# Patient Record
Sex: Female | Born: 1950 | Race: White | Hispanic: No | Marital: Married | Smoking: Never smoker
Health system: Southern US, Community
[De-identification: ages and names within clinical notes are randomized; demographics above are authoritative.]

---

## 1998-02-18 ENCOUNTER — Ambulatory Visit (HOSPITAL_BASED_OUTPATIENT_CLINIC_OR_DEPARTMENT_OTHER): Admission: RE | Admit: 1998-02-18 | Discharge: 1998-02-18 | Payer: Self-pay | Admitting: General Surgery

## 1998-06-02 ENCOUNTER — Other Ambulatory Visit: Admission: RE | Admit: 1998-06-02 | Discharge: 1998-06-02 | Payer: Self-pay | Admitting: Obstetrics & Gynecology

## 1999-05-19 ENCOUNTER — Other Ambulatory Visit: Admission: RE | Admit: 1999-05-19 | Discharge: 1999-05-19 | Payer: Self-pay | Admitting: *Deleted

## 1999-06-03 ENCOUNTER — Encounter: Admission: RE | Admit: 1999-06-03 | Discharge: 1999-06-03 | Payer: Self-pay | Admitting: Family Medicine

## 1999-06-03 ENCOUNTER — Encounter: Payer: Self-pay | Admitting: Family Medicine

## 2000-06-29 ENCOUNTER — Other Ambulatory Visit: Admission: RE | Admit: 2000-06-29 | Discharge: 2000-06-29 | Payer: Self-pay | Admitting: *Deleted

## 2000-08-08 ENCOUNTER — Encounter: Admission: RE | Admit: 2000-08-08 | Discharge: 2000-08-08 | Payer: Self-pay | Admitting: Family Medicine

## 2000-08-08 ENCOUNTER — Encounter: Payer: Self-pay | Admitting: Family Medicine

## 2001-07-19 ENCOUNTER — Other Ambulatory Visit: Admission: RE | Admit: 2001-07-19 | Discharge: 2001-07-19 | Payer: Self-pay | Admitting: *Deleted

## 2001-09-06 ENCOUNTER — Encounter: Admission: RE | Admit: 2001-09-06 | Discharge: 2001-09-06 | Payer: Self-pay | Admitting: *Deleted

## 2002-03-09 ENCOUNTER — Ambulatory Visit (HOSPITAL_COMMUNITY): Admission: RE | Admit: 2002-03-09 | Discharge: 2002-03-09 | Payer: Self-pay | Admitting: *Deleted

## 2002-07-24 ENCOUNTER — Other Ambulatory Visit: Admission: RE | Admit: 2002-07-24 | Discharge: 2002-07-24 | Payer: Self-pay | Admitting: *Deleted

## 2002-09-24 ENCOUNTER — Encounter: Admission: RE | Admit: 2002-09-24 | Discharge: 2002-09-24 | Payer: Self-pay | Admitting: *Deleted

## 2003-08-21 ENCOUNTER — Other Ambulatory Visit: Admission: RE | Admit: 2003-08-21 | Discharge: 2003-08-21 | Payer: Self-pay | Admitting: *Deleted

## 2003-12-11 ENCOUNTER — Ambulatory Visit (HOSPITAL_COMMUNITY): Admission: RE | Admit: 2003-12-11 | Discharge: 2003-12-11 | Payer: Self-pay | Admitting: *Deleted

## 2005-01-12 ENCOUNTER — Ambulatory Visit (HOSPITAL_COMMUNITY): Admission: RE | Admit: 2005-01-12 | Discharge: 2005-01-12 | Payer: Self-pay | Admitting: *Deleted

## 2006-01-18 ENCOUNTER — Ambulatory Visit (HOSPITAL_COMMUNITY): Admission: RE | Admit: 2006-01-18 | Discharge: 2006-01-18 | Payer: Self-pay | Admitting: *Deleted

## 2007-02-07 ENCOUNTER — Ambulatory Visit (HOSPITAL_COMMUNITY): Admission: RE | Admit: 2007-02-07 | Discharge: 2007-02-07 | Payer: Self-pay | Admitting: Obstetrics & Gynecology

## 2008-02-09 ENCOUNTER — Ambulatory Visit (HOSPITAL_COMMUNITY): Admission: RE | Admit: 2008-02-09 | Discharge: 2008-02-09 | Payer: Self-pay | Admitting: Obstetrics & Gynecology

## 2009-02-18 ENCOUNTER — Ambulatory Visit (HOSPITAL_COMMUNITY): Admission: RE | Admit: 2009-02-18 | Discharge: 2009-02-18 | Payer: Self-pay | Admitting: Family Medicine

## 2009-02-21 ENCOUNTER — Encounter: Admission: RE | Admit: 2009-02-21 | Discharge: 2009-02-21 | Payer: Self-pay | Admitting: Family Medicine

## 2010-05-01 ENCOUNTER — Encounter: Admission: RE | Admit: 2010-05-01 | Discharge: 2010-05-01 | Payer: Self-pay | Admitting: Family Medicine

## 2011-11-02 ENCOUNTER — Other Ambulatory Visit: Payer: Self-pay | Admitting: Family Medicine

## 2011-11-02 DIAGNOSIS — Z1231 Encounter for screening mammogram for malignant neoplasm of breast: Secondary | ICD-10-CM

## 2011-11-17 ENCOUNTER — Ambulatory Visit: Payer: Self-pay

## 2011-12-02 ENCOUNTER — Ambulatory Visit
Admission: RE | Admit: 2011-12-02 | Discharge: 2011-12-02 | Disposition: A | Discharge status: Home / Self Care | Payer: BC Managed Care – PPO | Source: Ambulatory Visit | Attending: Family Medicine | Admitting: Family Medicine

## 2011-12-02 DIAGNOSIS — Z1231 Encounter for screening mammogram for malignant neoplasm of breast: Secondary | ICD-10-CM

## 2013-02-27 ENCOUNTER — Other Ambulatory Visit (HOSPITAL_COMMUNITY): Payer: Self-pay | Admitting: Family Medicine

## 2013-02-27 DIAGNOSIS — Z1231 Encounter for screening mammogram for malignant neoplasm of breast: Secondary | ICD-10-CM

## 2013-03-13 ENCOUNTER — Ambulatory Visit (HOSPITAL_COMMUNITY)
Admission: RE | Admit: 2013-03-13 | Discharge: 2013-03-13 | Disposition: A | Discharge status: Home / Self Care | Payer: BC Managed Care – PPO | Source: Ambulatory Visit | Attending: Family Medicine | Admitting: Family Medicine

## 2013-03-13 DIAGNOSIS — Z1231 Encounter for screening mammogram for malignant neoplasm of breast: Secondary | ICD-10-CM | POA: Insufficient documentation

## 2014-05-08 ENCOUNTER — Other Ambulatory Visit (HOSPITAL_COMMUNITY): Payer: Self-pay | Admitting: Family Medicine

## 2014-05-08 DIAGNOSIS — Z1231 Encounter for screening mammogram for malignant neoplasm of breast: Secondary | ICD-10-CM

## 2014-05-28 ENCOUNTER — Ambulatory Visit (HOSPITAL_COMMUNITY)
Admission: RE | Admit: 2014-05-28 | Discharge: 2014-05-28 | Disposition: A | Discharge status: Home / Self Care | Payer: BC Managed Care – PPO | Source: Ambulatory Visit | Attending: Family Medicine | Admitting: Family Medicine

## 2014-05-28 DIAGNOSIS — Z1231 Encounter for screening mammogram for malignant neoplasm of breast: Secondary | ICD-10-CM | POA: Diagnosis present

## 2014-07-28 ENCOUNTER — Encounter (HOSPITAL_COMMUNITY): Payer: Self-pay | Admitting: *Deleted

## 2014-07-28 ENCOUNTER — Emergency Department (HOSPITAL_COMMUNITY): Payer: BLUE CROSS/BLUE SHIELD

## 2014-07-28 ENCOUNTER — Emergency Department (HOSPITAL_COMMUNITY)
Admission: EM | Admit: 2014-07-28 | Discharge: 2014-07-28 | Disposition: A | Discharge status: Home / Self Care | Payer: BLUE CROSS/BLUE SHIELD | Attending: Emergency Medicine | Admitting: Emergency Medicine

## 2014-07-28 DIAGNOSIS — N201 Calculus of ureter: Secondary | ICD-10-CM | POA: Diagnosis not present

## 2014-07-28 DIAGNOSIS — N132 Hydronephrosis with renal and ureteral calculous obstruction: Secondary | ICD-10-CM

## 2014-07-28 DIAGNOSIS — Z79899 Other long term (current) drug therapy: Secondary | ICD-10-CM | POA: Insufficient documentation

## 2014-07-28 DIAGNOSIS — N1330 Unspecified hydronephrosis: Secondary | ICD-10-CM | POA: Insufficient documentation

## 2014-07-28 DIAGNOSIS — N23 Unspecified renal colic: Secondary | ICD-10-CM

## 2014-07-28 DIAGNOSIS — R109 Unspecified abdominal pain: Secondary | ICD-10-CM

## 2014-07-28 LAB — URINALYSIS, ROUTINE W REFLEX MICROSCOPIC
BILIRUBIN URINE: NEGATIVE
GLUCOSE, UA: NEGATIVE mg/dL
Hgb urine dipstick: NEGATIVE
KETONES UR: 15 mg/dL — AB
LEUKOCYTES UA: NEGATIVE
NITRITE: NEGATIVE
PH: 7 (ref 5.0–8.0)
Protein, ur: NEGATIVE mg/dL
Specific Gravity, Urine: 1.025 (ref 1.005–1.030)
Urobilinogen, UA: 0.2 mg/dL (ref 0.0–1.0)

## 2014-07-28 LAB — CBC WITH DIFFERENTIAL/PLATELET
Basophils Absolute: 0 10*3/uL (ref 0.0–0.1)
Basophils Relative: 0 % (ref 0–1)
EOS ABS: 0 10*3/uL (ref 0.0–0.7)
Eosinophils Relative: 0 % (ref 0–5)
HEMATOCRIT: 40.5 % (ref 36.0–46.0)
Hemoglobin: 13.8 g/dL (ref 12.0–15.0)
LYMPHS ABS: 0.6 10*3/uL — AB (ref 0.7–4.0)
LYMPHS PCT: 5 % — AB (ref 12–46)
MCH: 30.7 pg (ref 26.0–34.0)
MCHC: 34.1 g/dL (ref 30.0–36.0)
MCV: 90.2 fL (ref 78.0–100.0)
MONO ABS: 0.5 10*3/uL (ref 0.1–1.0)
Monocytes Relative: 5 % (ref 3–12)
NEUTROS ABS: 10 10*3/uL — AB (ref 1.7–7.7)
Neutrophils Relative %: 90 % — ABNORMAL HIGH (ref 43–77)
PLATELETS: 229 10*3/uL (ref 150–400)
RBC: 4.49 MIL/uL (ref 3.87–5.11)
RDW: 12.4 % (ref 11.5–15.5)
WBC: 11.1 10*3/uL — ABNORMAL HIGH (ref 4.0–10.5)

## 2014-07-28 LAB — BASIC METABOLIC PANEL
ANION GAP: 9 (ref 5–15)
BUN: 23 mg/dL (ref 6–23)
CHLORIDE: 106 mmol/L (ref 96–112)
CO2: 24 mmol/L (ref 19–32)
CREATININE: 0.96 mg/dL (ref 0.50–1.10)
Calcium: 9.8 mg/dL (ref 8.4–10.5)
GFR calc Af Amer: 71 mL/min — ABNORMAL LOW (ref >90)
GFR, EST NON AFRICAN AMERICAN: 62 mL/min — AB (ref >90)
GLUCOSE: 143 mg/dL — AB (ref 70–99)
POTASSIUM: 3.6 mmol/L (ref 3.5–5.1)
SODIUM: 139 mmol/L (ref 135–145)

## 2014-07-28 MED ORDER — ONDANSETRON HCL 4 MG/2ML IJ SOLN
4.0000 mg | Freq: Once | INTRAMUSCULAR | Status: AC
Start: 2014-07-28 — End: 2014-07-28
  Administered 2014-07-28: 4 mg via INTRAVENOUS
  Filled 2014-07-28: qty 2

## 2014-07-28 MED ORDER — ONDANSETRON 8 MG PO TBDP: 8.0000 mg | ORAL_TABLET | Freq: Three times a day (TID) | ORAL | Status: AC | PRN

## 2014-07-28 MED ORDER — ONDANSETRON HCL 4 MG/2ML IJ SOLN
4.0000 mg | Freq: Once | INTRAMUSCULAR | Status: AC
  Administered 2014-07-28: 4 mg via INTRAVENOUS
  Filled 2014-07-28: qty 2

## 2014-07-28 MED ORDER — HYDROCODONE-ACETAMINOPHEN 5-325 MG PO TABS
1.0000 | ORAL_TABLET | Freq: Once | ORAL | Status: AC
  Administered 2014-07-28: 1 via ORAL
  Filled 2014-07-28: qty 1

## 2014-07-28 MED ORDER — HYDROMORPHONE HCL 1 MG/ML IJ SOLN
1.0000 mg | Freq: Once | INTRAMUSCULAR | Status: AC
Start: 2014-07-28 — End: 2014-07-28
  Administered 2014-07-28: 1 mg via INTRAVENOUS
  Filled 2014-07-28: qty 1

## 2014-07-28 MED ORDER — HYDROCODONE-ACETAMINOPHEN 5-325 MG PO TABS: 1.0000 | ORAL_TABLET | Freq: Four times a day (QID) | ORAL | Status: AC | PRN

## 2014-07-28 MED ORDER — FENTANYL CITRATE 0.05 MG/ML IJ SOLN
50.0000 ug | Freq: Once | INTRAMUSCULAR | Status: AC
Start: 2014-07-28 — End: 2014-07-28
  Administered 2014-07-28: 50 ug via INTRAVENOUS
  Filled 2014-07-28: qty 2

## 2014-07-28 NOTE — ED Notes (Signed)
Per pt report: pt reports having burning in her vagina and urgency that began around 02:00.  At about 05:00, pt began to have nausea and vomiting. Pt also reports having pain in her lower right side of her back.  Pt a/o x 4. Skin warm and dry. Pt ambulatory.

## 2014-07-28 NOTE — ED Provider Notes (Signed)
CSN: [id number]     Arrival date & time 07/28/14  8250 History   First MD Initiated Contact with Patient 07/28/14 [phone]     Chief Complaint  Patient presents with  . Flank Pain  . Vaginal Pain     (Consider location/radiation/quality/duration/timing/severity/associated sxs/prior Treatment) HPI Comments: Pt comes in with cc of flank pain. Pt has no medical hx. She reports waking up at 2 am, feeling some burning type pain around her vagina, spasming type pain. Soon after, she started having R back pain. Pt has associated nausea, and may be some cold sweats too. Pain is constant, and staying in the same region. No hx of same problems in the past. Pt has no pelvic organ dysfunction, no hx of renal stones and she has not noted any bloody in her urine.   ROS 10 Systems reviewed and are negative for acute change except as noted in the HPI.     Patient is a 64 y.o. female presenting with flank pain and vaginal pain. The history is provided by the patient.  Flank Pain  Vaginal Pain    History reviewed. No pertinent past medical history. History reviewed. No pertinent past surgical history. No family history on file. History  Substance Use Topics  . Smoking status: Never Smoker   . Smokeless tobacco: Not on file  . Alcohol Use: No   OB History    No data available     Review of Systems  Constitutional: Positive for diaphoresis.  Gastrointestinal: Positive for nausea.  Genitourinary: Positive for flank pain and vaginal pain.  All other systems reviewed and are negative.     Allergies  Review of patient's allergies indicates no known allergies.  Home Medications   Prior to Admission medications   Medication Sig Start Date End Date Taking? Authorizing Provider  aspirin EC 81 MG tablet Take 81 mg by mouth 2 (two) times a week.   Yes Historical Provider, MD  cholecalciferol (VITAMIN D) 1000 UNITS tablet Take 1,000 Units by mouth daily.   Yes Historical Provider, MD   BP  131/63 mmHg  Pulse 63  Temp(Src) 97.5 F (36.4 C) (Oral)  Resp 15  SpO2 100% Physical Exam  Constitutional: She is oriented to person, place, and time. She appears well-developed and well-nourished.  HENT:  Head: Normocephalic and atraumatic.  Eyes: EOM are normal. Pupils are equal, round, and reactive to light.  Neck: Neck supple.  Cardiovascular: Normal rate, regular rhythm and normal heart sounds.   No murmur heard. Pulmonary/Chest: Effort normal. No respiratory distress.  Abdominal: Soft. She exhibits no distension. There is tenderness. There is no rebound and no guarding.  Rt flank tenderness  Neurological: She is alert and oriented to person, place, and time.  Skin: Skin is warm and dry.  Nursing note and vitals reviewed.   ED Course  Procedures (including critical care time) Labs Review Labs Reviewed  CBC WITH DIFFERENTIAL/PLATELET - Abnormal; Notable for the following:    WBC 11.1 (*)    Neutrophils Relative % 90 (*)    Neutro Abs 10.0 (*)    Lymphocytes Relative 5 (*)    Lymphs Abs 0.6 (*)    All other components within normal limits  BASIC METABOLIC PANEL - Abnormal; Notable for the following:    Glucose, Bld 143 (*)    GFR calc non Af Amer 62 (*)    GFR calc Af Amer 71 (*)    All other components within normal limits  URINALYSIS, ROUTINE W  REFLEX MICROSCOPIC - Abnormal; Notable for the following:    Ketones, ur 15 (*)    All other components within normal limits  URINE CULTURE    Imaging Review Ct Renal Stone Study  07/28/2014   CLINICAL DATA:  Right-sided flank pain since 2 a.m. this morning. Vaginal pain/burning. Nausea and vomiting. Evaluate renal stone.  EXAM: CT ABDOMEN AND PELVIS WITHOUT CONTRAST  TECHNIQUE: Multidetector CT imaging of the abdomen and pelvis was performed following the standard protocol without IV contrast.  COMPARISON:  None.  FINDINGS: The lack of intravenous contrast limits the ability to evaluate solid abdominal organs.  There is a  punctate (approximately 0.4 x 0.2 cm stone within the distal aspect of the right ureter (image 63, series 2) which results in mild upstream ureterectasis and moderate right-sided pelvicaliectasis. There is an additional punctate (approximately 1 mm) nonobstructing stone within the interpolar aspect of the right kidney (axial image 33, series 2).  No evidence of left-sided nephrolithiasis or urinary obstruction. Normal noncontrast appearance of the urinary bladder given degree distention.   ----------------------------------------------------------------------------  Normal hepatic contour. Normal appearance of the gallbladder given degree distention. No radiopaque gallstones. No ascites.  Normal noncontrast appearance of the bilateral adrenal glands, pancreas and spleen.  Bowel is normal in course and caliber without wall thickening or evidence of obstruction the appendix is not definitely identified, however is no pericecal inflammatory change. No pneumoperitoneum, pneumatosis or portal venous gas.  Scattered very minimal amount of atherosclerotic plaque within a normal caliber abdominal aorta. No definitive bulky retroperitoneal, mesenteric, pelvic or inguinal lymphadenopathy on this noncontrast examination.  Normal noncontrast appearance of the pelvic organs. No free fluid within the pelvic cul-de-sac.  Limited visualization the lower thorax demonstrates minimal subsegmental atelectasis within the imaged bilateral lower lobes. No discrete focal airspace opacities. No pleural effusion.  Normal heart size.  No pericardial effusion.  No acute or aggressive osseous abnormalities. Moderate DDD of L4-L5 with disc space height loss, endplate irregularity and sclerosis.  Regional soft tissues appear normal.  IMPRESSION: 1. Punctate (approximately 4 x 2 mm) stone within the distal aspect of the right ureter results in mild upstream ureterectasis and moderate right-sided pelvicaliectasis. 2. Note is made of an additional  punctate (approximately 1-2 mm) nonobstructing is stone within the interpolar aspect of the right kidney. 3. No evidence of left-sided nephrolithiasis or urinary obstruction.   Electronically Signed   By: Sandi Mariscal M.D.   On: 07/28/2014 08:30     EKG Interpretation None      9:56 AM Pain controlled now. Given fentanyl essentially, now dilaudid. CT results discussed.  Return precautions discussed.     MDM   Final diagnoses:  Acute right flank pain  Ureteral stone with hydronephrosis  Ureteral colic    Pt comes in with cc of acute R flank pain and some burning type sensation around her vagina with urgency. UA is clean. Sx started suddenly, and CT shows renal stone, which is what we suspected. The stone is at the UPJ and is causing some inflammation. Pain is under control now, so anticipate discharge.     Varney Biles, MD 07/28/14 1002

## 2014-07-28 NOTE — Discharge Instructions (Signed)
You have a kidney stone. Currently, the symptoms are under control, and so we can safely discharge you. Most of the stones pass on their own, and we need to just control the pain. Call the Urologist for an appointment, if the pain continues - even if it is tolerable. Come to the ER if the pain is intolerable. Also come back to the ER if there are fevers, chills, inability to keep any fluids down, confusion, large blood clots passing.   Kidney Stones Kidney stones (urolithiasis) are deposits that form inside your kidneys. The intense pain is caused by the stone moving through the urinary tract. When the stone moves, the ureter goes into spasm around the stone. The stone is usually passed in the urine.  CAUSES   A disorder that makes certain neck glands produce too much parathyroid hormone (primary hyperparathyroidism).  A buildup of uric acid crystals, similar to gout in your joints.  Narrowing (stricture) of the ureter.  A kidney obstruction present at birth (congenital obstruction).  Previous surgery on the kidney or ureters.  Numerous kidney infections. SYMPTOMS   Feeling sick to your stomach (nauseous).  Throwing up (vomiting).  Blood in the urine (hematuria).  Pain that usually spreads (radiates) to the groin.  Frequency or urgency of urination. DIAGNOSIS   Taking a history and physical exam.  Blood or urine tests.  CT scan.  Occasionally, an examination of the inside of the urinary bladder (cystoscopy) is performed. TREATMENT   Observation.  Increasing your fluid intake.  Extracorporeal shock wave lithotripsy--This is a noninvasive procedure that uses shock waves to break up kidney stones.  Surgery may be needed if you have severe pain or persistent obstruction. There are various surgical procedures. Most of the procedures are performed with the use of small instruments. Only small incisions are needed to accommodate these instruments, so recovery time is  minimized. The size, location, and chemical composition are all important variables that will determine the proper choice of action for you. Talk to your health care provider to better understand your situation so that you will minimize the risk of injury to yourself and your kidney.  HOME CARE INSTRUCTIONS   Drink enough water and fluids to keep your urine clear or pale yellow. This will help you to pass the stone or stone fragments.  Strain all urine through the provided strainer. Keep all particulate matter and stones for your health care provider to see. The stone causing the pain may be as small as a grain of salt. It is very important to use the strainer each and every time you pass your urine. The collection of your stone will allow your health care provider to analyze it and verify that a stone has actually passed. The stone analysis will often identify what you can do to reduce the incidence of recurrences.  Only take over-the-counter or prescription medicines for pain, discomfort, or fever as directed by your health care provider.  Make a follow-up appointment with your health care provider as directed.  Get follow-up X-rays if required. The absence of pain does not always mean that the stone has passed. It may have only stopped moving. If the urine remains completely obstructed, it can cause loss of kidney function or even complete destruction of the kidney. It is your responsibility to make sure X-rays and follow-ups are completed. Ultrasounds of the kidney can show blockages and the status of the kidney. Ultrasounds are not associated with any radiation and can be performed easily  in a matter of minutes. SEEK MEDICAL CARE IF:  You experience pain that is progressive and unresponsive to any pain medicine you have been prescribed. SEEK IMMEDIATE MEDICAL CARE IF:   Pain cannot be controlled with the prescribed medicine.  You have a fever or shaking chills.  The severity or intensity  of pain increases over 18 hours and is not relieved by pain medicine.  You develop a new onset of abdominal pain.  You feel faint or pass out.  You are unable to urinate. MAKE SURE YOU:   Understand these instructions.  Will watch your condition.  Will get help right away if you are not doing well or get worse. Document Released: 06/07/2005 Document Revised: 02/07/2013 Document Reviewed: 11/08/2012 [facility] Patient Information 2015 [place], [place]. This information is not intended to replace advice given to you by your health care provider. Make sure you discuss any questions you have with your health care provider.  Ureteral Colic (Kidney Stones) Ureteral colic is the result of a condition when kidney stones form inside the kidney. Once kidney stones are formed they may move into the tube that connects the kidney with the bladder (ureter). If this occurs, this condition may cause pain (colic) in the ureter.  CAUSES  Pain is caused by stone movement in the ureter and the obstruction caused by the stone. SYMPTOMS  The pain comes and goes as the ureter contracts around the stone. The pain is usually intense, sharp, and stabbing in character. The location of the pain may move as the stone moves through the ureter. When the stone is near the kidney the pain is usually located in the back and radiates to the belly (abdomen). When the stone is ready to pass into the bladder the pain is often located in the lower abdomen on the side the stone is located. At this location, the symptoms may mimic those of a urinary tract infection with urinary frequency. Once the stone is located here it often passes into the bladder and the pain disappears completely. TREATMENT   Your caregiver will provide you with medicine for pain relief.  You may require specialized follow-up X-rays.  The absence of pain does not always mean that the stone has passed. It may have just stopped moving. If the urine remains  completely obstructed, it can cause loss of kidney function or even complete destruction of the involved kidney. It is your responsibility and in your interest that X-rays and follow-ups as suggested by your caregiver are completed. Relief of pain without passage of the stone can be associated with severe damage to the kidney, including loss of kidney function on that side.  If your stone does not pass on its own, additional measures may be taken by your caregiver to ensure its removal. HOME CARE INSTRUCTIONS   Increase your fluid intake. Water is the preferred fluid since juices containing vitamin C may acidify the urine making it less likely for certain stones (uric acid stones) to pass.  Strain all urine. A strainer will be provided. Keep all particulate matter or stones for your caregiver to inspect.  Take your pain medicine as directed.  Make a follow-up appointment with your caregiver as directed.  Remember that the goal is passage of your stone. The absence of pain does not mean the stone is gone. Follow your caregiver's instructions.  Only take over-the-counter or prescription medicines for pain, discomfort, or fever as directed by your caregiver. SEEK MEDICAL CARE IF:   Pain cannot  be controlled with the prescribed medicine.  You have a fever.  Pain continues for longer than your caregiver advises it should.  There is a change in the pain, and you develop chest discomfort or constant abdominal pain.  You feel faint or pass out. MAKE SURE YOU:   Understand these instructions.  Will watch your condition.  Will get help right away if you are not doing well or get worse. Document Released: 03/17/2005 Document Revised: 10/02/2012 Document Reviewed: 12/02/2010 [facility] Patient Information 2015 [place], [place]. This information is not intended to replace advice given to you by your health care provider. Make sure you discuss any questions you have with your health care  provider.

## 2014-07-30 LAB — URINE CULTURE
COLONY COUNT: NO GROWTH
CULTURE: NO GROWTH

## 2015-08-14 ENCOUNTER — Other Ambulatory Visit: Payer: Self-pay

## 2015-08-14 DIAGNOSIS — Z1231 Encounter for screening mammogram for malignant neoplasm of breast: Secondary | ICD-10-CM

## 2015-08-27 ENCOUNTER — Ambulatory Visit
Admission: RE | Admit: 2015-08-27 | Discharge: 2015-08-27 | Disposition: A | Discharge status: Home / Self Care | Payer: BLUE CROSS/BLUE SHIELD | Source: Ambulatory Visit

## 2015-08-27 DIAGNOSIS — Z1231 Encounter for screening mammogram for malignant neoplasm of breast: Secondary | ICD-10-CM

## 2016-03-03 DIAGNOSIS — Z0000 Encounter for general adult medical examination without abnormal findings: Secondary | ICD-10-CM | POA: Diagnosis not present

## 2016-03-03 DIAGNOSIS — Z13820 Encounter for screening for osteoporosis: Secondary | ICD-10-CM | POA: Diagnosis not present

## 2016-03-03 DIAGNOSIS — Z23 Encounter for immunization: Secondary | ICD-10-CM | POA: Diagnosis not present

## 2016-03-03 DIAGNOSIS — Z79899 Other long term (current) drug therapy: Secondary | ICD-10-CM | POA: Diagnosis not present

## 2016-03-03 DIAGNOSIS — E785 Hyperlipidemia, unspecified: Secondary | ICD-10-CM | POA: Diagnosis not present

## 2016-03-23 DIAGNOSIS — M8588 Other specified disorders of bone density and structure, other site: Secondary | ICD-10-CM | POA: Diagnosis not present

## 2016-03-23 DIAGNOSIS — E2839 Other primary ovarian failure: Secondary | ICD-10-CM | POA: Diagnosis not present

## 2016-04-02 DIAGNOSIS — M899 Disorder of bone, unspecified: Secondary | ICD-10-CM | POA: Diagnosis not present

## 2016-05-07 DIAGNOSIS — M25512 Pain in left shoulder: Secondary | ICD-10-CM | POA: Diagnosis not present

## 2016-05-18 DIAGNOSIS — M25512 Pain in left shoulder: Secondary | ICD-10-CM | POA: Diagnosis not present

## 2016-05-18 DIAGNOSIS — M7542 Impingement syndrome of left shoulder: Secondary | ICD-10-CM | POA: Diagnosis not present

## 2016-05-18 DIAGNOSIS — M7502 Adhesive capsulitis of left shoulder: Secondary | ICD-10-CM | POA: Diagnosis not present

## 2016-06-16 DIAGNOSIS — M25512 Pain in left shoulder: Secondary | ICD-10-CM | POA: Diagnosis not present

## 2016-06-22 DIAGNOSIS — M7542 Impingement syndrome of left shoulder: Secondary | ICD-10-CM | POA: Diagnosis not present

## 2016-06-22 DIAGNOSIS — M7502 Adhesive capsulitis of left shoulder: Secondary | ICD-10-CM | POA: Diagnosis not present

## 2016-06-22 DIAGNOSIS — M25512 Pain in left shoulder: Secondary | ICD-10-CM | POA: Diagnosis not present

## 2016-06-23 DIAGNOSIS — M7502 Adhesive capsulitis of left shoulder: Secondary | ICD-10-CM | POA: Diagnosis not present

## 2016-06-25 DIAGNOSIS — L821 Other seborrheic keratosis: Secondary | ICD-10-CM | POA: Diagnosis not present

## 2016-06-25 DIAGNOSIS — D2372 Other benign neoplasm of skin of left lower limb, including hip: Secondary | ICD-10-CM | POA: Diagnosis not present

## 2016-06-25 DIAGNOSIS — D1801 Hemangioma of skin and subcutaneous tissue: Secondary | ICD-10-CM | POA: Diagnosis not present

## 2016-06-25 DIAGNOSIS — M7502 Adhesive capsulitis of left shoulder: Secondary | ICD-10-CM | POA: Diagnosis not present

## 2016-06-25 DIAGNOSIS — L853 Xerosis cutis: Secondary | ICD-10-CM | POA: Diagnosis not present

## 2016-06-25 DIAGNOSIS — D225 Melanocytic nevi of trunk: Secondary | ICD-10-CM | POA: Diagnosis not present

## 2016-06-25 DIAGNOSIS — L814 Other melanin hyperpigmentation: Secondary | ICD-10-CM | POA: Diagnosis not present

## 2016-06-25 DIAGNOSIS — D2261 Melanocytic nevi of right upper limb, including shoulder: Secondary | ICD-10-CM | POA: Diagnosis not present

## 2016-07-06 DIAGNOSIS — M7502 Adhesive capsulitis of left shoulder: Secondary | ICD-10-CM | POA: Diagnosis not present

## 2016-07-08 IMAGING — CT CT RENAL STONE PROTOCOL
1 series · 11 of 32 positions shown, 13 images · non-contrast
Comparison: None.

CLINICAL DATA: Right-sided flank pain since 2 a.m. this morning.
Vaginal pain/burning. Nausea and vomiting. Evaluate renal stone.

EXAM:
CT ABDOMEN AND PELVIS WITHOUT CONTRAST
TECHNIQUE: Multidetector CT imaging of the abdomen and pelvis was performed
following the standard protocol without IV contrast.

[Series 6: sagittal · sagittal · 0.61mm/px · 11 of 128 slices shown, 13 images]
[im 5/128  lung]
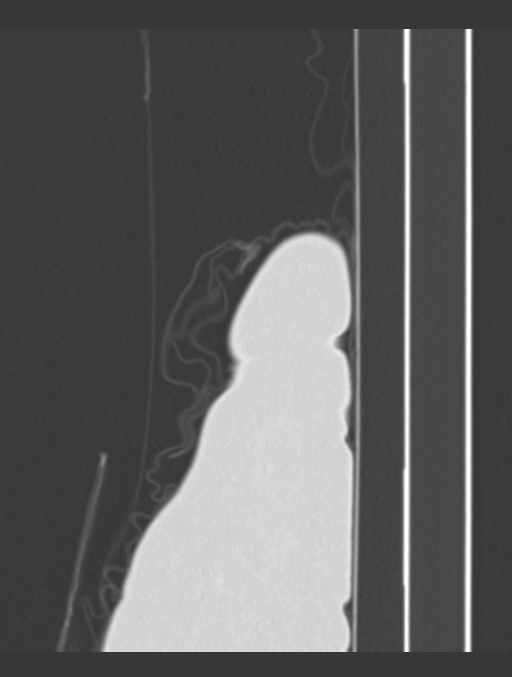
[im 9/128  soft-tissue]
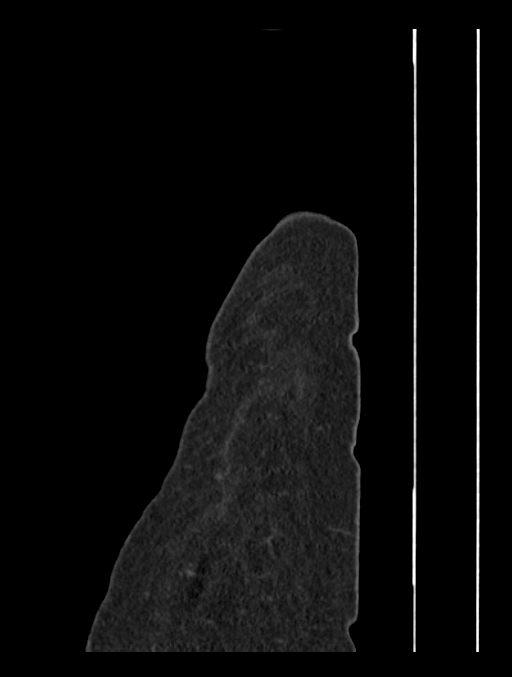
[im 9/128  lung]
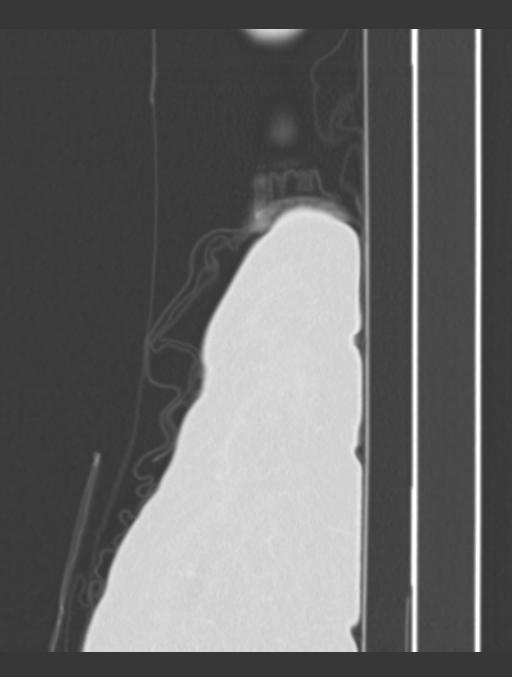
[im 9/128  bone]
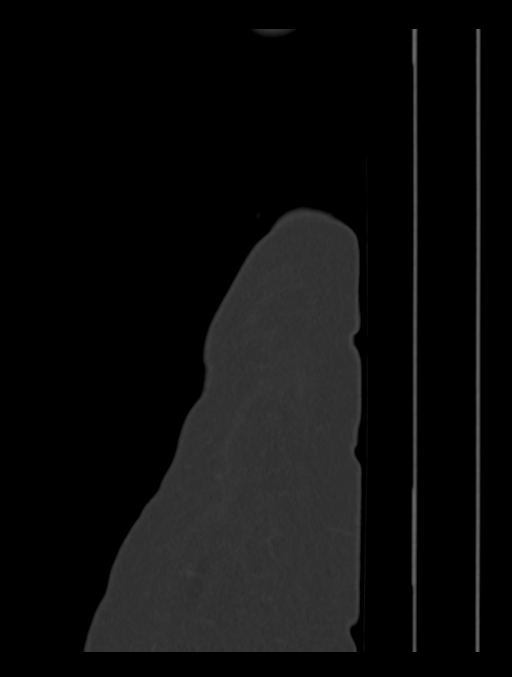
[im 13/128  lung]
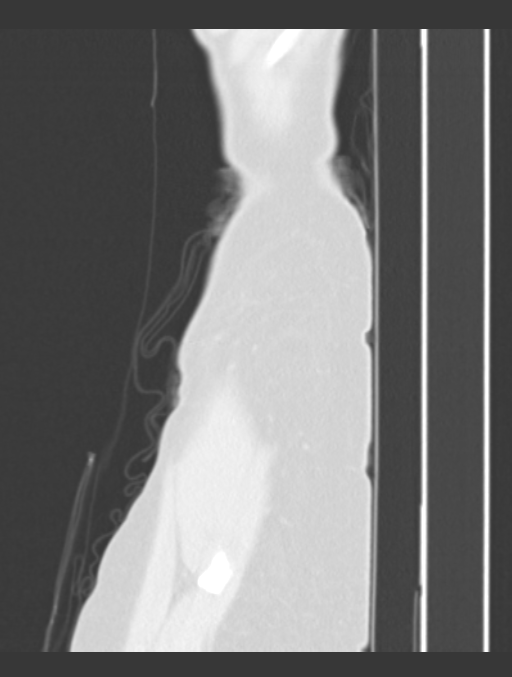
[im 17/128  lung]
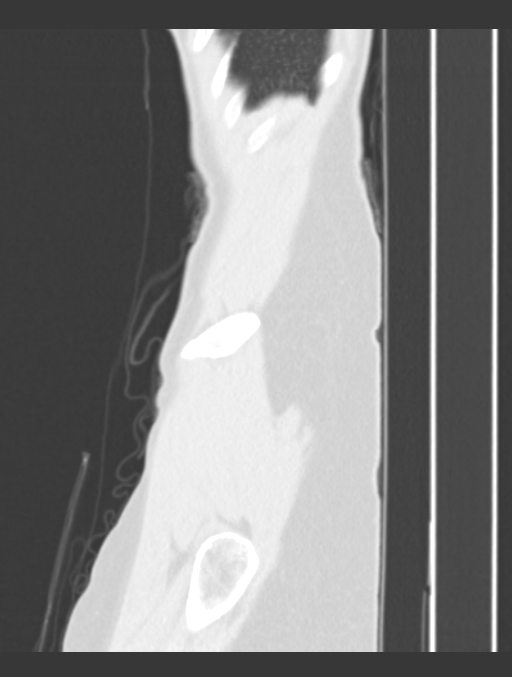
[im 25/128  soft-tissue]
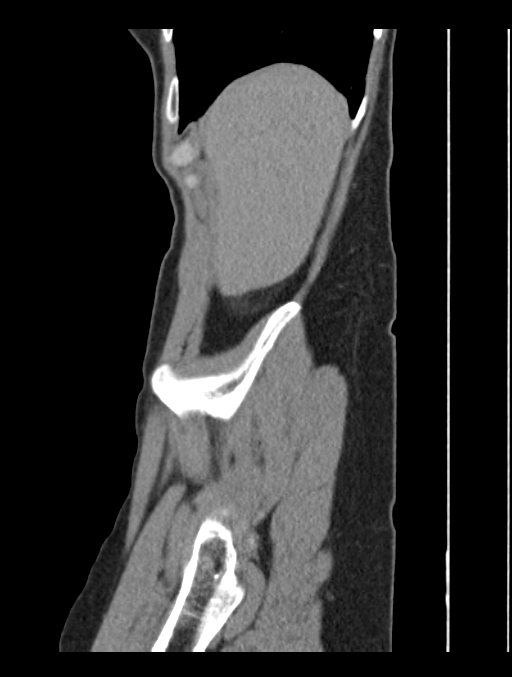
[im 41/128  soft-tissue]
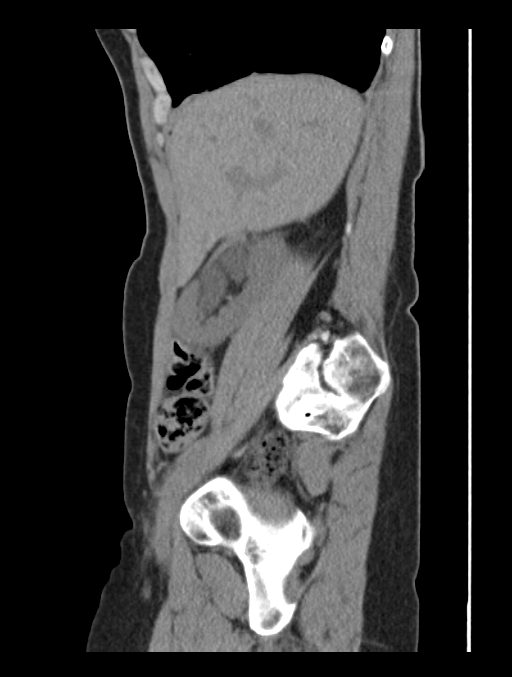
[im 58/128  soft-tissue]
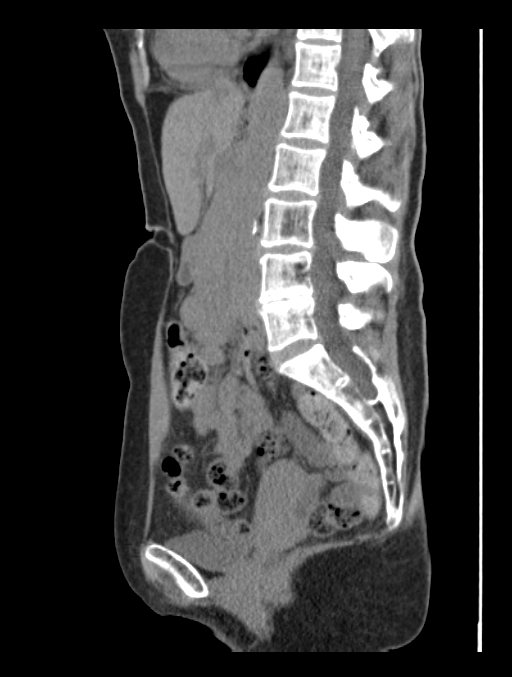
[im 70/128  soft-tissue]
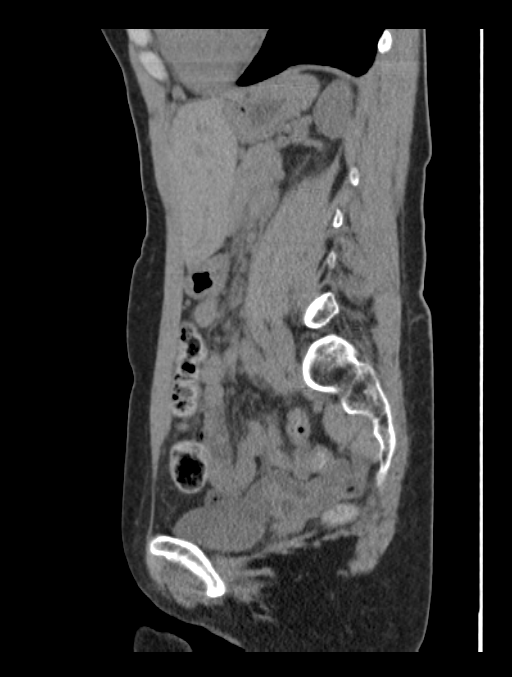
[im 87/128  soft-tissue]
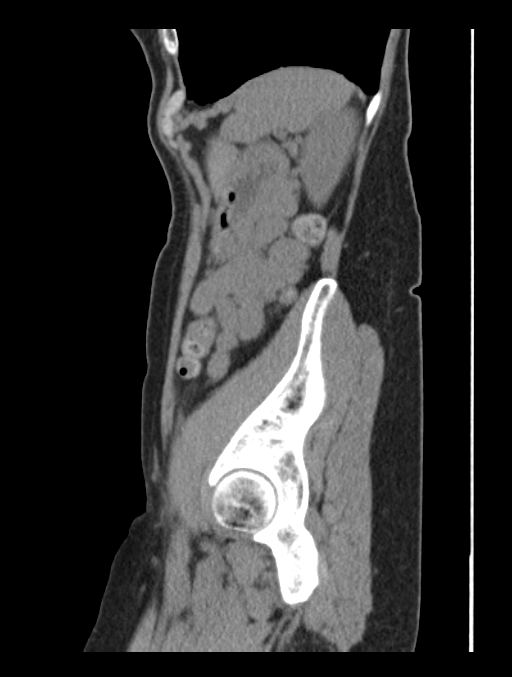
[im 103/128  soft-tissue]
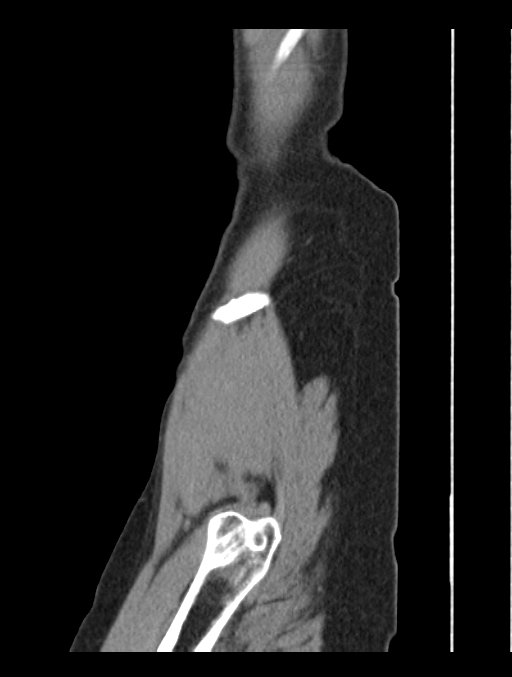
[im 119/128  soft-tissue]
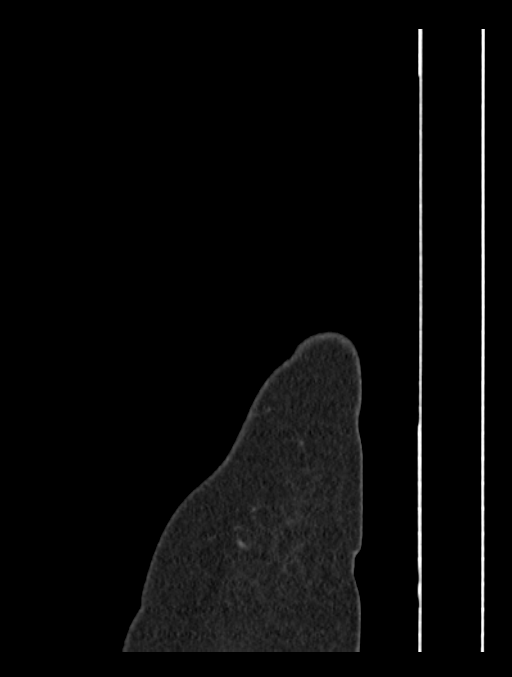

[11 of 32 positions shown; findings below may reference images not displayed]

FINDINGS: The lack of intravenous contrast limits the ability to evaluate
solid abdominal organs.

There is a punctate (approximately 0.4 x 0.2 cm stone within the
distal aspect of the right ureter (image 63, series 2) which results
in mild upstream ureterectasis and moderate right-sided
pelvicaliectasis. There is an additional punctate (approximately 1
mm) nonobstructing stone within the interpolar aspect of the right
kidney (axial image 33, series 2).

No evidence of left-sided nephrolithiasis or urinary obstruction.
Normal noncontrast appearance of the urinary bladder given degree
distention.

----------------------------------------------------------------------------

Normal hepatic contour. Normal appearance of the gallbladder given
degree distention. No radiopaque gallstones. No ascites.

Normal noncontrast appearance of the bilateral adrenal glands,
pancreas and spleen.

Bowel is normal in course and caliber without wall thickening or
evidence of obstruction the appendix is not definitely identified,
however is no pericecal inflammatory change. No pneumoperitoneum,
pneumatosis or portal venous gas.

Scattered very minimal amount of atherosclerotic plaque within a
normal caliber abdominal aorta. No definitive bulky retroperitoneal,
mesenteric, pelvic or inguinal lymphadenopathy on this noncontrast
examination.

Normal noncontrast appearance of the pelvic organs. No free fluid
within the pelvic cul-de-sac.

Limited visualization the lower thorax demonstrates minimal
subsegmental atelectasis within the imaged bilateral lower lobes. No
discrete focal airspace opacities. No pleural effusion.

Normal heart size.  No pericardial effusion.

No acute or aggressive osseous abnormalities. Moderate DDD of L4-L5
with disc space height loss, endplate irregularity and sclerosis.

Regional soft tissues appear normal.
IMPRESSION: 1. Punctate (approximately 4 x 2 mm) stone within the distal aspect
of the right ureter results in mild upstream ureterectasis and
moderate right-sided pelvicaliectasis.
2. Note is made of an additional punctate (approximately 1-2 mm)
nonobstructing is stone within the interpolar aspect of the right
kidney.
3. No evidence of left-sided nephrolithiasis or urinary obstruction.

## 2016-07-13 DIAGNOSIS — M7502 Adhesive capsulitis of left shoulder: Secondary | ICD-10-CM | POA: Diagnosis not present

## 2016-07-16 DIAGNOSIS — M7502 Adhesive capsulitis of left shoulder: Secondary | ICD-10-CM | POA: Diagnosis not present

## 2016-07-20 DIAGNOSIS — M7502 Adhesive capsulitis of left shoulder: Secondary | ICD-10-CM | POA: Diagnosis not present

## 2016-07-23 DIAGNOSIS — M7502 Adhesive capsulitis of left shoulder: Secondary | ICD-10-CM | POA: Diagnosis not present

## 2016-07-27 DIAGNOSIS — M7502 Adhesive capsulitis of left shoulder: Secondary | ICD-10-CM | POA: Diagnosis not present

## 2016-07-30 DIAGNOSIS — M7502 Adhesive capsulitis of left shoulder: Secondary | ICD-10-CM | POA: Diagnosis not present

## 2016-08-03 DIAGNOSIS — M7502 Adhesive capsulitis of left shoulder: Secondary | ICD-10-CM | POA: Diagnosis not present

## 2016-08-06 DIAGNOSIS — M7502 Adhesive capsulitis of left shoulder: Secondary | ICD-10-CM | POA: Diagnosis not present

## 2016-08-10 DIAGNOSIS — M7502 Adhesive capsulitis of left shoulder: Secondary | ICD-10-CM | POA: Diagnosis not present

## 2016-08-13 DIAGNOSIS — M7502 Adhesive capsulitis of left shoulder: Secondary | ICD-10-CM | POA: Diagnosis not present

## 2016-08-17 DIAGNOSIS — M7542 Impingement syndrome of left shoulder: Secondary | ICD-10-CM | POA: Diagnosis not present

## 2016-08-17 DIAGNOSIS — M7502 Adhesive capsulitis of left shoulder: Secondary | ICD-10-CM | POA: Diagnosis not present

## 2016-08-31 DIAGNOSIS — H5213 Myopia, bilateral: Secondary | ICD-10-CM | POA: Diagnosis not present

## 2016-11-24 DIAGNOSIS — Z1231 Encounter for screening mammogram for malignant neoplasm of breast: Secondary | ICD-10-CM | POA: Diagnosis not present

## 2016-11-24 DIAGNOSIS — Z01419 Encounter for gynecological examination (general) (routine) without abnormal findings: Secondary | ICD-10-CM | POA: Diagnosis not present

## 2016-11-24 DIAGNOSIS — Z124 Encounter for screening for malignant neoplasm of cervix: Secondary | ICD-10-CM | POA: Diagnosis not present

## 2017-03-01 DIAGNOSIS — Z23 Encounter for immunization: Secondary | ICD-10-CM | POA: Diagnosis not present

## 2017-03-10 DIAGNOSIS — Z0000 Encounter for general adult medical examination without abnormal findings: Secondary | ICD-10-CM | POA: Diagnosis not present

## 2017-03-10 DIAGNOSIS — Z79899 Other long term (current) drug therapy: Secondary | ICD-10-CM | POA: Diagnosis not present

## 2017-03-10 DIAGNOSIS — E785 Hyperlipidemia, unspecified: Secondary | ICD-10-CM | POA: Diagnosis not present

## 2017-03-10 DIAGNOSIS — Z23 Encounter for immunization: Secondary | ICD-10-CM | POA: Diagnosis not present

## 2017-03-10 DIAGNOSIS — M85851 Other specified disorders of bone density and structure, right thigh: Secondary | ICD-10-CM | POA: Diagnosis not present

## 2017-03-10 DIAGNOSIS — M899 Disorder of bone, unspecified: Secondary | ICD-10-CM | POA: Diagnosis not present

## 2017-06-28 DIAGNOSIS — D225 Melanocytic nevi of trunk: Secondary | ICD-10-CM | POA: Diagnosis not present

## 2017-06-28 DIAGNOSIS — L821 Other seborrheic keratosis: Secondary | ICD-10-CM | POA: Diagnosis not present

## 2017-06-28 DIAGNOSIS — D2372 Other benign neoplasm of skin of left lower limb, including hip: Secondary | ICD-10-CM | POA: Diagnosis not present

## 2017-06-28 DIAGNOSIS — D2261 Melanocytic nevi of right upper limb, including shoulder: Secondary | ICD-10-CM | POA: Diagnosis not present

## 2017-06-28 DIAGNOSIS — D1801 Hemangioma of skin and subcutaneous tissue: Secondary | ICD-10-CM | POA: Diagnosis not present

## 2018-03-03 DIAGNOSIS — Z124 Encounter for screening for malignant neoplasm of cervix: Secondary | ICD-10-CM | POA: Diagnosis not present

## 2018-03-03 DIAGNOSIS — Z1231 Encounter for screening mammogram for malignant neoplasm of breast: Secondary | ICD-10-CM | POA: Diagnosis not present

## 2018-03-16 DIAGNOSIS — E785 Hyperlipidemia, unspecified: Secondary | ICD-10-CM | POA: Diagnosis not present

## 2018-03-16 DIAGNOSIS — Z23 Encounter for immunization: Secondary | ICD-10-CM | POA: Diagnosis not present

## 2018-03-16 DIAGNOSIS — M899 Disorder of bone, unspecified: Secondary | ICD-10-CM | POA: Diagnosis not present

## 2018-03-16 DIAGNOSIS — Z79899 Other long term (current) drug therapy: Secondary | ICD-10-CM | POA: Diagnosis not present

## 2018-03-22 DIAGNOSIS — Z1389 Encounter for screening for other disorder: Secondary | ICD-10-CM | POA: Diagnosis not present

## 2018-03-22 DIAGNOSIS — E785 Hyperlipidemia, unspecified: Secondary | ICD-10-CM | POA: Diagnosis not present

## 2018-03-22 DIAGNOSIS — Z79899 Other long term (current) drug therapy: Secondary | ICD-10-CM | POA: Diagnosis not present

## 2018-03-22 DIAGNOSIS — R899 Unspecified abnormal finding in specimens from other organs, systems and tissues: Secondary | ICD-10-CM | POA: Diagnosis not present

## 2018-03-22 DIAGNOSIS — Z1159 Encounter for screening for other viral diseases: Secondary | ICD-10-CM | POA: Diagnosis not present

## 2018-03-22 DIAGNOSIS — Z0000 Encounter for general adult medical examination without abnormal findings: Secondary | ICD-10-CM | POA: Diagnosis not present

## 2018-03-22 DIAGNOSIS — M85851 Other specified disorders of bone density and structure, right thigh: Secondary | ICD-10-CM | POA: Diagnosis not present

## 2018-05-16 DIAGNOSIS — H43812 Vitreous degeneration, left eye: Secondary | ICD-10-CM | POA: Diagnosis not present

## 2018-05-30 DIAGNOSIS — H43812 Vitreous degeneration, left eye: Secondary | ICD-10-CM | POA: Diagnosis not present

## 2018-06-28 DIAGNOSIS — L814 Other melanin hyperpigmentation: Secondary | ICD-10-CM | POA: Diagnosis not present

## 2018-06-28 DIAGNOSIS — L821 Other seborrheic keratosis: Secondary | ICD-10-CM | POA: Diagnosis not present

## 2018-06-28 DIAGNOSIS — D2372 Other benign neoplasm of skin of left lower limb, including hip: Secondary | ICD-10-CM | POA: Diagnosis not present

## 2018-06-28 DIAGNOSIS — D225 Melanocytic nevi of trunk: Secondary | ICD-10-CM | POA: Diagnosis not present

## 2018-06-28 DIAGNOSIS — L853 Xerosis cutis: Secondary | ICD-10-CM | POA: Diagnosis not present

## 2018-06-28 DIAGNOSIS — D1801 Hemangioma of skin and subcutaneous tissue: Secondary | ICD-10-CM | POA: Diagnosis not present

## 2019-03-05 DIAGNOSIS — Z23 Encounter for immunization: Secondary | ICD-10-CM | POA: Diagnosis not present

## 2019-10-24 ENCOUNTER — Other Ambulatory Visit: Payer: Self-pay | Admitting: Family Medicine

## 2019-10-24 DIAGNOSIS — R002 Palpitations: Secondary | ICD-10-CM | POA: Diagnosis not present

## 2019-10-24 DIAGNOSIS — M899 Disorder of bone, unspecified: Secondary | ICD-10-CM | POA: Diagnosis not present

## 2019-10-24 DIAGNOSIS — E785 Hyperlipidemia, unspecified: Secondary | ICD-10-CM | POA: Diagnosis not present

## 2019-10-24 DIAGNOSIS — I493 Ventricular premature depolarization: Secondary | ICD-10-CM | POA: Diagnosis not present

## 2019-10-24 DIAGNOSIS — Z0000 Encounter for general adult medical examination without abnormal findings: Secondary | ICD-10-CM | POA: Diagnosis not present

## 2019-10-24 DIAGNOSIS — Z79899 Other long term (current) drug therapy: Secondary | ICD-10-CM | POA: Diagnosis not present

## 2019-10-24 DIAGNOSIS — M8580 Other specified disorders of bone density and structure, unspecified site: Secondary | ICD-10-CM

## 2019-10-24 DIAGNOSIS — M85859 Other specified disorders of bone density and structure, unspecified thigh: Secondary | ICD-10-CM | POA: Diagnosis not present

## 2019-10-31 DIAGNOSIS — Z1231 Encounter for screening mammogram for malignant neoplasm of breast: Secondary | ICD-10-CM | POA: Diagnosis not present

## 2019-10-31 DIAGNOSIS — Z01419 Encounter for gynecological examination (general) (routine) without abnormal findings: Secondary | ICD-10-CM | POA: Diagnosis not present

## 2019-10-31 DIAGNOSIS — Z124 Encounter for screening for malignant neoplasm of cervix: Secondary | ICD-10-CM | POA: Diagnosis not present

## 2019-11-07 DIAGNOSIS — H43812 Vitreous degeneration, left eye: Secondary | ICD-10-CM | POA: Diagnosis not present

## 2019-11-07 DIAGNOSIS — H2513 Age-related nuclear cataract, bilateral: Secondary | ICD-10-CM | POA: Diagnosis not present

## 2019-11-07 DIAGNOSIS — H5213 Myopia, bilateral: Secondary | ICD-10-CM | POA: Diagnosis not present

## 2020-01-24 ENCOUNTER — Other Ambulatory Visit: Payer: Self-pay

## 2020-01-24 ENCOUNTER — Ambulatory Visit
Admission: RE | Admit: 2020-01-24 | Discharge: 2020-01-24 | Disposition: A | Discharge status: Home / Self Care | Payer: Medicare Other | Source: Ambulatory Visit | Attending: Family Medicine | Admitting: Family Medicine

## 2020-01-24 DIAGNOSIS — M8589 Other specified disorders of bone density and structure, multiple sites: Secondary | ICD-10-CM | POA: Diagnosis not present

## 2020-01-24 DIAGNOSIS — Z780 Asymptomatic menopausal state: Secondary | ICD-10-CM | POA: Diagnosis not present

## 2020-01-24 DIAGNOSIS — M8580 Other specified disorders of bone density and structure, unspecified site: Secondary | ICD-10-CM

## 2020-01-29 DIAGNOSIS — D2372 Other benign neoplasm of skin of left lower limb, including hip: Secondary | ICD-10-CM | POA: Diagnosis not present

## 2020-01-29 DIAGNOSIS — L814 Other melanin hyperpigmentation: Secondary | ICD-10-CM | POA: Diagnosis not present

## 2020-01-29 DIAGNOSIS — L821 Other seborrheic keratosis: Secondary | ICD-10-CM | POA: Diagnosis not present

## 2020-01-29 DIAGNOSIS — D1801 Hemangioma of skin and subcutaneous tissue: Secondary | ICD-10-CM | POA: Diagnosis not present

## 2020-01-29 DIAGNOSIS — D225 Melanocytic nevi of trunk: Secondary | ICD-10-CM | POA: Diagnosis not present

## 2020-01-29 DIAGNOSIS — L570 Actinic keratosis: Secondary | ICD-10-CM | POA: Diagnosis not present

## 2020-03-11 DIAGNOSIS — Z23 Encounter for immunization: Secondary | ICD-10-CM | POA: Diagnosis not present

## 2020-03-27 DIAGNOSIS — Z23 Encounter for immunization: Secondary | ICD-10-CM | POA: Diagnosis not present

## 2020-09-30 DIAGNOSIS — Z23 Encounter for immunization: Secondary | ICD-10-CM | POA: Diagnosis not present

## 2020-10-28 DIAGNOSIS — Z1389 Encounter for screening for other disorder: Secondary | ICD-10-CM | POA: Diagnosis not present

## 2020-10-28 DIAGNOSIS — Z0000 Encounter for general adult medical examination without abnormal findings: Secondary | ICD-10-CM | POA: Diagnosis not present

## 2020-10-29 DIAGNOSIS — M8580 Other specified disorders of bone density and structure, unspecified site: Secondary | ICD-10-CM | POA: Diagnosis not present

## 2020-10-29 DIAGNOSIS — H6123 Impacted cerumen, bilateral: Secondary | ICD-10-CM | POA: Diagnosis not present

## 2020-10-29 DIAGNOSIS — E7800 Pure hypercholesterolemia, unspecified: Secondary | ICD-10-CM | POA: Diagnosis not present

## 2020-11-12 DIAGNOSIS — H5213 Myopia, bilateral: Secondary | ICD-10-CM | POA: Diagnosis not present

## 2020-11-12 DIAGNOSIS — H2513 Age-related nuclear cataract, bilateral: Secondary | ICD-10-CM | POA: Diagnosis not present

## 2020-11-12 DIAGNOSIS — H02055 Trichiasis without entropian left lower eyelid: Secondary | ICD-10-CM | POA: Diagnosis not present

## 2020-12-29 DIAGNOSIS — Z1231 Encounter for screening mammogram for malignant neoplasm of breast: Secondary | ICD-10-CM | POA: Diagnosis not present

## 2020-12-29 DIAGNOSIS — Z01411 Encounter for gynecological examination (general) (routine) with abnormal findings: Secondary | ICD-10-CM | POA: Diagnosis not present

## 2020-12-29 DIAGNOSIS — Z01419 Encounter for gynecological examination (general) (routine) without abnormal findings: Secondary | ICD-10-CM | POA: Diagnosis not present

## 2020-12-29 DIAGNOSIS — Z6821 Body mass index (BMI) 21.0-21.9, adult: Secondary | ICD-10-CM | POA: Diagnosis not present

## 2020-12-29 DIAGNOSIS — Z124 Encounter for screening for malignant neoplasm of cervix: Secondary | ICD-10-CM | POA: Diagnosis not present

## 2021-01-28 DIAGNOSIS — D225 Melanocytic nevi of trunk: Secondary | ICD-10-CM | POA: Diagnosis not present

## 2021-01-28 DIAGNOSIS — L570 Actinic keratosis: Secondary | ICD-10-CM | POA: Diagnosis not present

## 2021-01-28 DIAGNOSIS — L821 Other seborrheic keratosis: Secondary | ICD-10-CM | POA: Diagnosis not present

## 2021-01-28 DIAGNOSIS — D1801 Hemangioma of skin and subcutaneous tissue: Secondary | ICD-10-CM | POA: Diagnosis not present

## 2021-03-10 DIAGNOSIS — Z23 Encounter for immunization: Secondary | ICD-10-CM | POA: Diagnosis not present

## 2021-03-26 DIAGNOSIS — Z23 Encounter for immunization: Secondary | ICD-10-CM | POA: Diagnosis not present

## 2021-10-31 DIAGNOSIS — Z23 Encounter for immunization: Secondary | ICD-10-CM | POA: Diagnosis not present

## 2021-11-11 DIAGNOSIS — Z0000 Encounter for general adult medical examination without abnormal findings: Secondary | ICD-10-CM | POA: Diagnosis not present

## 2021-11-11 DIAGNOSIS — Z23 Encounter for immunization: Secondary | ICD-10-CM | POA: Diagnosis not present

## 2021-11-11 DIAGNOSIS — I341 Nonrheumatic mitral (valve) prolapse: Secondary | ICD-10-CM | POA: Diagnosis not present

## 2021-11-11 DIAGNOSIS — H6123 Impacted cerumen, bilateral: Secondary | ICD-10-CM | POA: Diagnosis not present

## 2021-11-11 DIAGNOSIS — E782 Mixed hyperlipidemia: Secondary | ICD-10-CM | POA: Diagnosis not present

## 2021-12-18 DIAGNOSIS — H5213 Myopia, bilateral: Secondary | ICD-10-CM | POA: Diagnosis not present

## 2021-12-18 DIAGNOSIS — H2513 Age-related nuclear cataract, bilateral: Secondary | ICD-10-CM | POA: Diagnosis not present

## 2022-01-22 DIAGNOSIS — Z1231 Encounter for screening mammogram for malignant neoplasm of breast: Secondary | ICD-10-CM | POA: Diagnosis not present

## 2022-02-01 DIAGNOSIS — L814 Other melanin hyperpigmentation: Secondary | ICD-10-CM | POA: Diagnosis not present

## 2022-02-01 DIAGNOSIS — L821 Other seborrheic keratosis: Secondary | ICD-10-CM | POA: Diagnosis not present

## 2022-02-01 DIAGNOSIS — D1801 Hemangioma of skin and subcutaneous tissue: Secondary | ICD-10-CM | POA: Diagnosis not present

## 2022-02-01 DIAGNOSIS — D225 Melanocytic nevi of trunk: Secondary | ICD-10-CM | POA: Diagnosis not present

## 2022-03-10 DIAGNOSIS — Z01411 Encounter for gynecological examination (general) (routine) with abnormal findings: Secondary | ICD-10-CM | POA: Diagnosis not present

## 2022-03-10 DIAGNOSIS — Z124 Encounter for screening for malignant neoplasm of cervix: Secondary | ICD-10-CM | POA: Diagnosis not present

## 2022-03-10 DIAGNOSIS — Z0142 Encounter for cervical smear to confirm findings of recent normal smear following initial abnormal smear: Secondary | ICD-10-CM | POA: Diagnosis not present

## 2022-03-10 DIAGNOSIS — Z01419 Encounter for gynecological examination (general) (routine) without abnormal findings: Secondary | ICD-10-CM | POA: Diagnosis not present

## 2022-03-24 DIAGNOSIS — Z23 Encounter for immunization: Secondary | ICD-10-CM | POA: Diagnosis not present

## 2022-04-07 DIAGNOSIS — Z23 Encounter for immunization: Secondary | ICD-10-CM | POA: Diagnosis not present

## 2022-09-22 DIAGNOSIS — Z23 Encounter for immunization: Secondary | ICD-10-CM | POA: Diagnosis not present

## 2022-11-05 DIAGNOSIS — Z23 Encounter for immunization: Secondary | ICD-10-CM | POA: Diagnosis not present

## 2022-11-18 DIAGNOSIS — K648 Other hemorrhoids: Secondary | ICD-10-CM | POA: Diagnosis not present

## 2022-11-18 DIAGNOSIS — Z1211 Encounter for screening for malignant neoplasm of colon: Secondary | ICD-10-CM | POA: Diagnosis not present

## 2022-11-18 DIAGNOSIS — K635 Polyp of colon: Secondary | ICD-10-CM | POA: Diagnosis not present

## 2022-11-22 DIAGNOSIS — Z6821 Body mass index (BMI) 21.0-21.9, adult: Secondary | ICD-10-CM | POA: Diagnosis not present

## 2022-11-22 DIAGNOSIS — Z0000 Encounter for general adult medical examination without abnormal findings: Secondary | ICD-10-CM | POA: Diagnosis not present

## 2022-11-22 DIAGNOSIS — K635 Polyp of colon: Secondary | ICD-10-CM | POA: Diagnosis not present

## 2022-11-22 DIAGNOSIS — E782 Mixed hyperlipidemia: Secondary | ICD-10-CM | POA: Diagnosis not present

## 2022-11-22 DIAGNOSIS — M8588 Other specified disorders of bone density and structure, other site: Secondary | ICD-10-CM | POA: Diagnosis not present

## 2022-12-28 DIAGNOSIS — H2513 Age-related nuclear cataract, bilateral: Secondary | ICD-10-CM | POA: Diagnosis not present

## 2022-12-28 DIAGNOSIS — H5213 Myopia, bilateral: Secondary | ICD-10-CM | POA: Diagnosis not present

## 2023-02-04 DIAGNOSIS — Z1231 Encounter for screening mammogram for malignant neoplasm of breast: Secondary | ICD-10-CM | POA: Diagnosis not present

## 2023-03-11 DIAGNOSIS — Z23 Encounter for immunization: Secondary | ICD-10-CM | POA: Diagnosis not present

## 2023-03-28 DIAGNOSIS — Z23 Encounter for immunization: Secondary | ICD-10-CM | POA: Diagnosis not present

## 2023-03-31 DIAGNOSIS — D2372 Other benign neoplasm of skin of left lower limb, including hip: Secondary | ICD-10-CM | POA: Diagnosis not present

## 2023-03-31 DIAGNOSIS — D1801 Hemangioma of skin and subcutaneous tissue: Secondary | ICD-10-CM | POA: Diagnosis not present

## 2023-03-31 DIAGNOSIS — L814 Other melanin hyperpigmentation: Secondary | ICD-10-CM | POA: Diagnosis not present

## 2023-03-31 DIAGNOSIS — L718 Other rosacea: Secondary | ICD-10-CM | POA: Diagnosis not present

## 2023-03-31 DIAGNOSIS — D2261 Melanocytic nevi of right upper limb, including shoulder: Secondary | ICD-10-CM | POA: Diagnosis not present

## 2023-03-31 DIAGNOSIS — L821 Other seborrheic keratosis: Secondary | ICD-10-CM | POA: Diagnosis not present

## 2023-04-07 DIAGNOSIS — Z01411 Encounter for gynecological examination (general) (routine) with abnormal findings: Secondary | ICD-10-CM | POA: Diagnosis not present

## 2023-04-07 DIAGNOSIS — Z124 Encounter for screening for malignant neoplasm of cervix: Secondary | ICD-10-CM | POA: Diagnosis not present

## 2023-04-07 DIAGNOSIS — Z01419 Encounter for gynecological examination (general) (routine) without abnormal findings: Secondary | ICD-10-CM | POA: Diagnosis not present

## 2023-09-23 DIAGNOSIS — Z23 Encounter for immunization: Secondary | ICD-10-CM | POA: Diagnosis not present

## 2023-11-07 DIAGNOSIS — E559 Vitamin D deficiency, unspecified: Secondary | ICD-10-CM | POA: Diagnosis not present

## 2023-11-07 DIAGNOSIS — E782 Mixed hyperlipidemia: Secondary | ICD-10-CM | POA: Diagnosis not present

## 2023-11-07 DIAGNOSIS — H539 Unspecified visual disturbance: Secondary | ICD-10-CM | POA: Diagnosis not present

## 2023-11-07 DIAGNOSIS — Z0000 Encounter for general adult medical examination without abnormal findings: Secondary | ICD-10-CM | POA: Diagnosis not present

## 2023-11-07 DIAGNOSIS — Z6821 Body mass index (BMI) 21.0-21.9, adult: Secondary | ICD-10-CM | POA: Diagnosis not present

## 2023-11-23 DIAGNOSIS — Z1331 Encounter for screening for depression: Secondary | ICD-10-CM | POA: Diagnosis not present

## 2023-11-23 DIAGNOSIS — R030 Elevated blood-pressure reading, without diagnosis of hypertension: Secondary | ICD-10-CM | POA: Diagnosis not present

## 2023-11-23 DIAGNOSIS — Z0000 Encounter for general adult medical examination without abnormal findings: Secondary | ICD-10-CM | POA: Diagnosis not present

## 2023-11-23 DIAGNOSIS — E782 Mixed hyperlipidemia: Secondary | ICD-10-CM | POA: Diagnosis not present

## 2023-11-23 DIAGNOSIS — E559 Vitamin D deficiency, unspecified: Secondary | ICD-10-CM | POA: Diagnosis not present

## 2023-11-29 DIAGNOSIS — H5213 Myopia, bilateral: Secondary | ICD-10-CM | POA: Diagnosis not present

## 2023-11-29 DIAGNOSIS — H2513 Age-related nuclear cataract, bilateral: Secondary | ICD-10-CM | POA: Diagnosis not present

## 2023-11-29 DIAGNOSIS — H52203 Unspecified astigmatism, bilateral: Secondary | ICD-10-CM | POA: Diagnosis not present

## 2024-02-10 DIAGNOSIS — Z1231 Encounter for screening mammogram for malignant neoplasm of breast: Secondary | ICD-10-CM | POA: Diagnosis not present

## 2024-03-13 DIAGNOSIS — Z23 Encounter for immunization: Secondary | ICD-10-CM | POA: Diagnosis not present

## 2024-04-26 DIAGNOSIS — L718 Other rosacea: Secondary | ICD-10-CM | POA: Diagnosis not present

## 2024-04-26 DIAGNOSIS — L821 Other seborrheic keratosis: Secondary | ICD-10-CM | POA: Diagnosis not present

## 2024-04-26 DIAGNOSIS — L814 Other melanin hyperpigmentation: Secondary | ICD-10-CM | POA: Diagnosis not present

## 2024-04-26 DIAGNOSIS — D1801 Hemangioma of skin and subcutaneous tissue: Secondary | ICD-10-CM | POA: Diagnosis not present
